# Patient Record
Sex: Female | Born: 1995 | Race: Black or African American | Hispanic: No | Marital: Single | Smoking: Never smoker
Health system: Southern US, Community
[De-identification: ages and names within clinical notes are randomized; demographics above are authoritative.]

## PROBLEM LIST (undated history)

## (undated) HISTORY — PX: OTHER SURGICAL HISTORY: SHX169

---
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
## 2016-04-03 DIAGNOSIS — N912 Amenorrhea, unspecified: Secondary | ICD-10-CM | POA: Insufficient documentation

## 2016-10-13 ENCOUNTER — Encounter (HOSPITAL_BASED_OUTPATIENT_CLINIC_OR_DEPARTMENT_OTHER): Payer: Self-pay | Admitting: Emergency Medicine

## 2016-10-13 ENCOUNTER — Emergency Department (HOSPITAL_BASED_OUTPATIENT_CLINIC_OR_DEPARTMENT_OTHER): Payer: BLUE CROSS/BLUE SHIELD

## 2016-10-13 ENCOUNTER — Emergency Department (HOSPITAL_BASED_OUTPATIENT_CLINIC_OR_DEPARTMENT_OTHER)
Admission: EM | Admit: 2016-10-13 | Discharge: 2016-10-13 | Disposition: A | Discharge status: Home / Self Care | Payer: BLUE CROSS/BLUE SHIELD | Attending: Emergency Medicine | Admitting: Emergency Medicine

## 2016-10-13 DIAGNOSIS — S62025A Nondisplaced fracture of middle third of navicular [scaphoid] bone of left wrist, initial encounter for closed fracture: Secondary | ICD-10-CM | POA: Diagnosis not present

## 2016-10-13 DIAGNOSIS — Y999 Unspecified external cause status: Secondary | ICD-10-CM | POA: Diagnosis not present

## 2016-10-13 DIAGNOSIS — Y929 Unspecified place or not applicable: Secondary | ICD-10-CM | POA: Diagnosis not present

## 2016-10-13 DIAGNOSIS — Y939 Activity, unspecified: Secondary | ICD-10-CM | POA: Insufficient documentation

## 2016-10-13 DIAGNOSIS — S6992XA Unspecified injury of left wrist, hand and finger(s), initial encounter: Secondary | ICD-10-CM | POA: Diagnosis present

## 2016-10-13 NOTE — ED Notes (Signed)
Patient transported to X-ray 

## 2016-10-13 NOTE — Discharge Instructions (Signed)
You can take 800 mg of ibuprofen with food or 650 mg of Tylenol every 6 hours for pain control. Please call Dr. Debby Budoley's office on Monday morning to schedule a follow-up appointment. If he develop worsening symptoms, including numbness, weakness, or tingling in the fingers, please return to the emergency department for reevaluation.
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
## 2016-10-13 NOTE — ED Notes (Signed)
Ice pack given for comfort.

## 2017-05-10 ENCOUNTER — Encounter (HOSPITAL_BASED_OUTPATIENT_CLINIC_OR_DEPARTMENT_OTHER): Payer: Self-pay | Admitting: Emergency Medicine

## 2017-05-10 ENCOUNTER — Other Ambulatory Visit: Payer: Self-pay

## 2017-05-10 ENCOUNTER — Emergency Department (HOSPITAL_BASED_OUTPATIENT_CLINIC_OR_DEPARTMENT_OTHER)
Admission: EM | Admit: 2017-05-10 | Discharge: 2017-05-10 | Disposition: A | Discharge status: Home / Self Care | Payer: BLUE CROSS/BLUE SHIELD | Attending: Emergency Medicine | Admitting: Emergency Medicine

## 2017-05-10 DIAGNOSIS — S0990XA Unspecified injury of head, initial encounter: Secondary | ICD-10-CM | POA: Diagnosis present

## 2017-05-10 DIAGNOSIS — Y999 Unspecified external cause status: Secondary | ICD-10-CM | POA: Insufficient documentation

## 2017-05-10 DIAGNOSIS — Y929 Unspecified place or not applicable: Secondary | ICD-10-CM | POA: Diagnosis not present

## 2017-05-10 DIAGNOSIS — X58XXXA Exposure to other specified factors, initial encounter: Secondary | ICD-10-CM | POA: Insufficient documentation

## 2017-05-10 DIAGNOSIS — Y939 Activity, unspecified: Secondary | ICD-10-CM | POA: Diagnosis not present

## 2017-05-10 DIAGNOSIS — Z5321 Procedure and treatment not carried out due to patient leaving prior to being seen by health care provider: Secondary | ICD-10-CM | POA: Diagnosis not present
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
## 2019-03-09 IMAGING — DX DG WRIST COMPLETE 3+V*L*
4 series · 4 of 4 positions shown · non-contrast
Comparison: None available

CLINICAL DATA: Acute left radial wrist pain

EXAM:
LEFT WRIST - COMPLETE 3+ VIEW

[wrist pa]
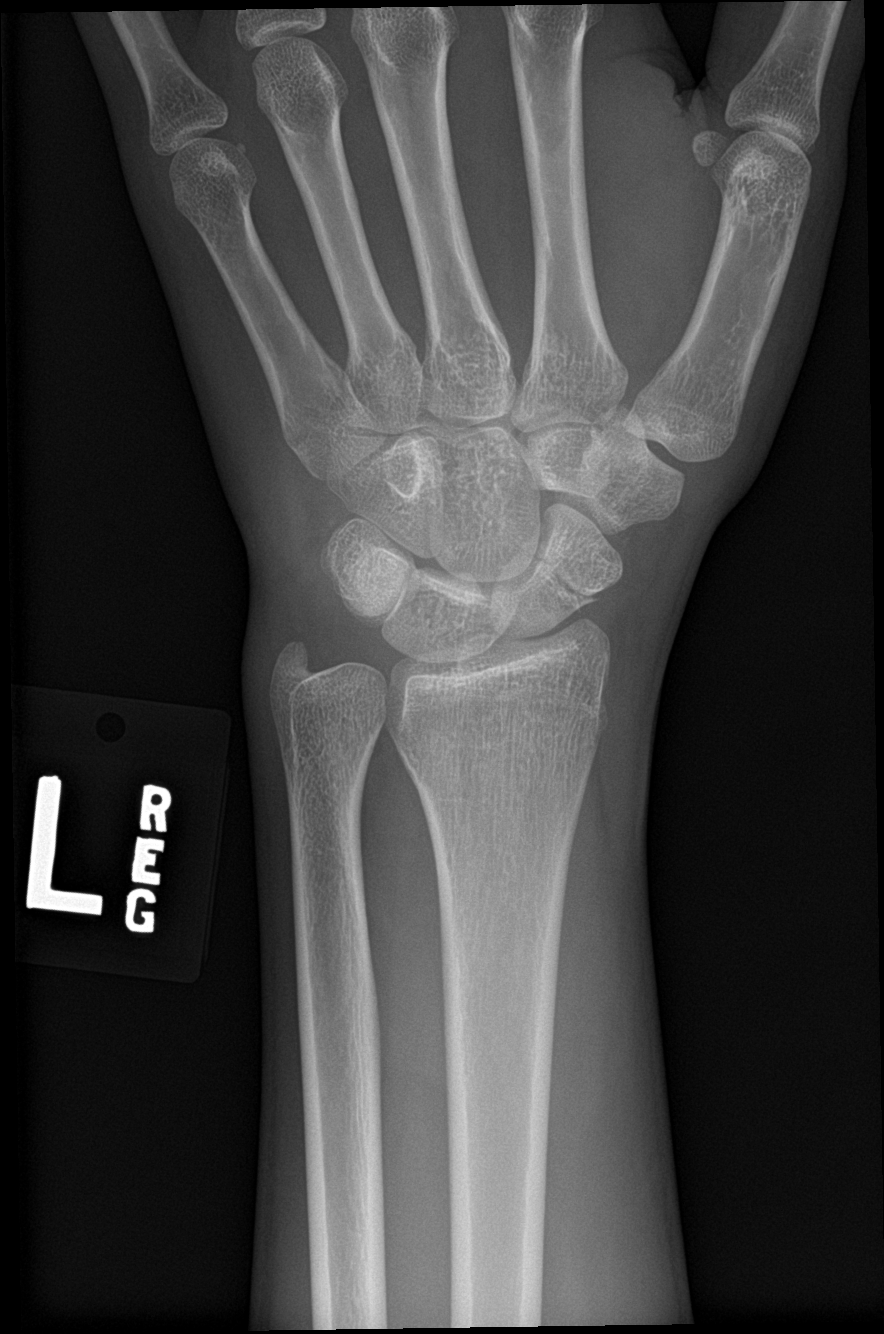

[wrist obl]
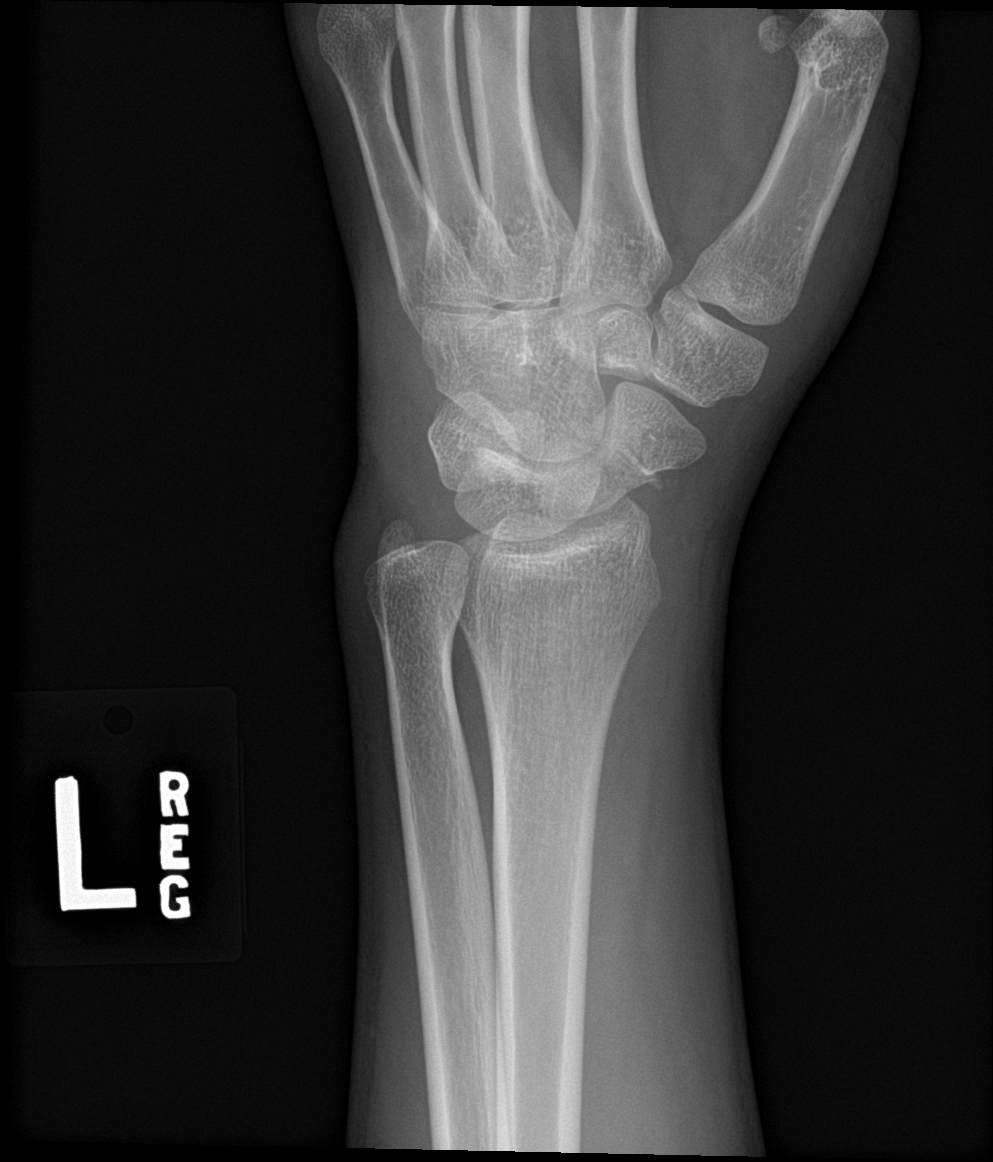

[wrist lat]
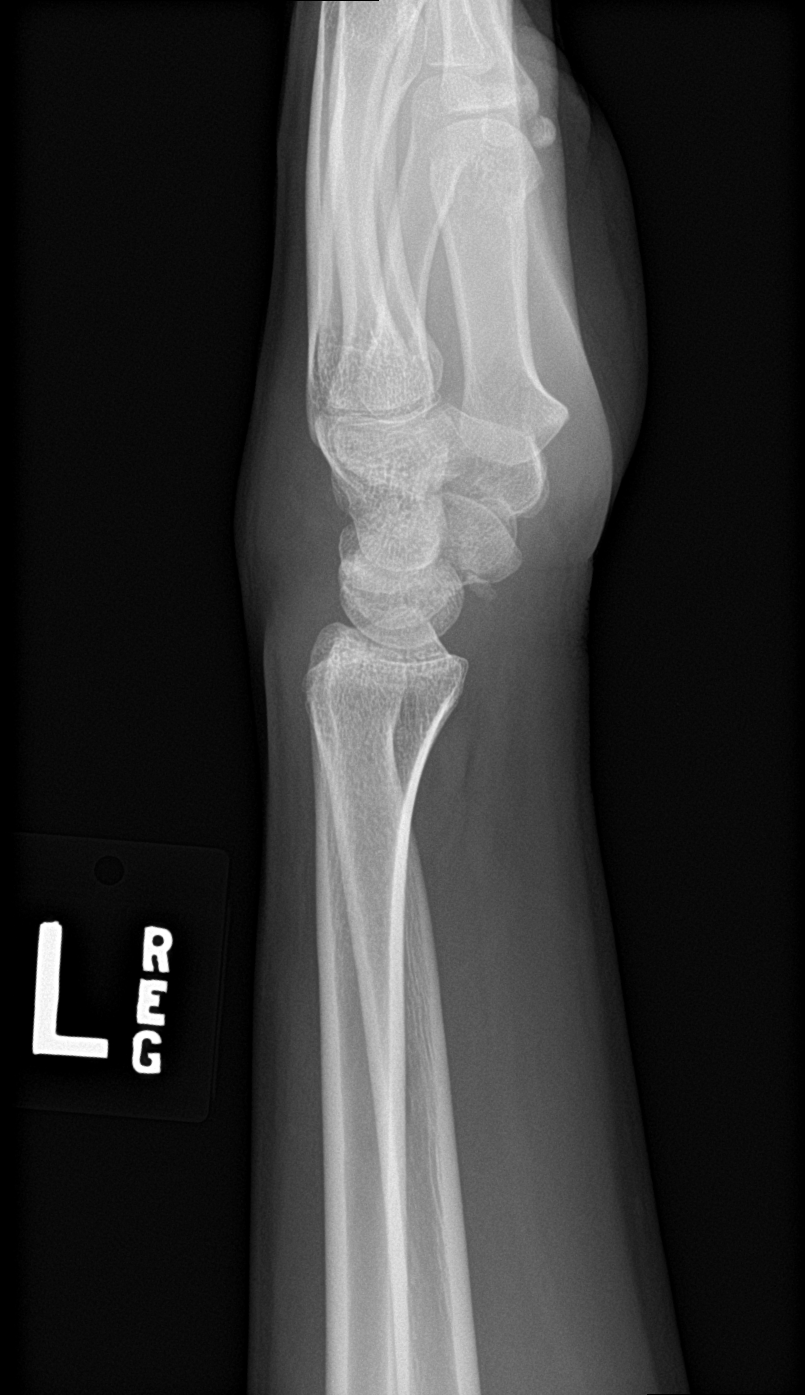

[wrist navicular]
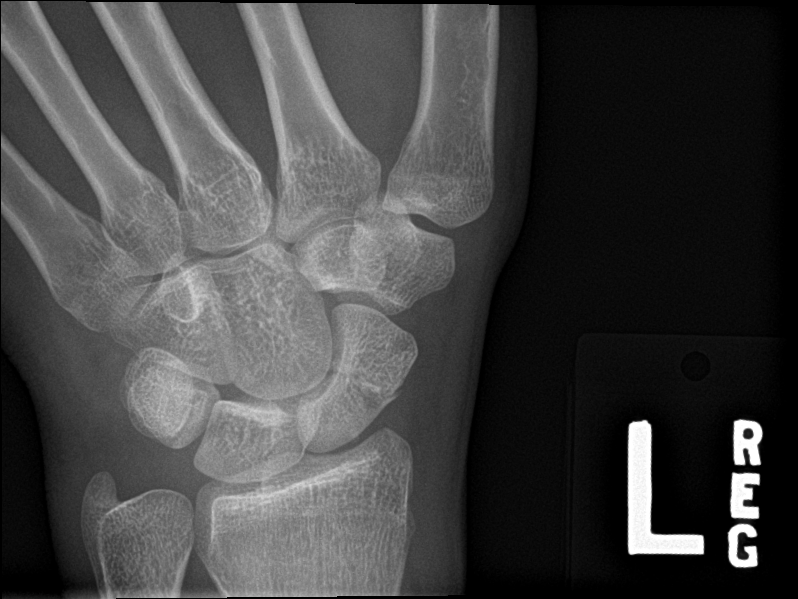

[4 of 4 positions shown; findings below may reference images not displayed]

FINDINGS: There is an acute nondisplaced scaphoid waist fracture. Distal
radius and ulna appear intact. Dorsal soft tissue swelling on the
lateral view. Other carpal bones appear intact. No subluxation or
dislocation.
IMPRESSION: Acute nondisplaced scaphoid waist fracture.

## 2020-08-18 LAB — HM PAP SMEAR

## 2022-07-23 ENCOUNTER — Emergency Department (HOSPITAL_BASED_OUTPATIENT_CLINIC_OR_DEPARTMENT_OTHER): Payer: Self-pay

## 2022-07-23 ENCOUNTER — Other Ambulatory Visit: Payer: Self-pay

## 2022-07-23 ENCOUNTER — Emergency Department (HOSPITAL_BASED_OUTPATIENT_CLINIC_OR_DEPARTMENT_OTHER)
Admission: EM | Admit: 2022-07-23 | Discharge: 2022-07-23 | Disposition: A | Discharge status: Home / Self Care | Payer: Self-pay | Attending: Emergency Medicine | Admitting: Emergency Medicine

## 2022-07-23 ENCOUNTER — Encounter (HOSPITAL_BASED_OUTPATIENT_CLINIC_OR_DEPARTMENT_OTHER): Payer: Self-pay | Admitting: Urology

## 2022-07-23 DIAGNOSIS — S098XXA Other specified injuries of head, initial encounter: Secondary | ICD-10-CM

## 2022-07-23 DIAGNOSIS — W228XXA Striking against or struck by other objects, initial encounter: Secondary | ICD-10-CM | POA: Insufficient documentation

## 2022-07-23 DIAGNOSIS — S0181XA Laceration without foreign body of other part of head, initial encounter: Secondary | ICD-10-CM

## 2022-07-23 DIAGNOSIS — S01111A Laceration without foreign body of right eyelid and periocular area, initial encounter: Secondary | ICD-10-CM | POA: Insufficient documentation

## 2022-07-23 LAB — PREGNANCY, URINE: Preg Test, Ur: NEGATIVE

## 2022-07-23 MED ORDER — ACETAMINOPHEN 325 MG PO TABS
650.0000 mg | ORAL_TABLET | Freq: Four times a day (QID) | ORAL | Status: DC | PRN
  Administered 2022-07-23: 650 mg via ORAL
  Filled 2022-07-23: qty 2

## 2022-07-23 MED ORDER — ONDANSETRON 4 MG PO TBDP
4.0000 mg | ORAL_TABLET | Freq: Once | ORAL | Status: AC
  Administered 2022-07-23: 4 mg via ORAL
  Filled 2022-07-23: qty 1

## 2022-07-23 MED ORDER — METOCLOPRAMIDE HCL 5 MG/ML IJ SOLN
10.0000 mg | Freq: Once | INTRAMUSCULAR | Status: AC
  Administered 2022-07-23: 10 mg via INTRAMUSCULAR
  Filled 2022-07-23: qty 2

## 2022-07-23 MED ORDER — LIDOCAINE HCL (PF) 1 % IJ SOLN
5.0000 mL | Freq: Once | INTRAMUSCULAR | Status: AC
  Administered 2022-07-23: 5 mL via INTRADERMAL
  Filled 2022-07-23: qty 5

## 2022-07-23 NOTE — Discharge Instructions (Addendum)
It was a pleasure taking care of you today. Your CT was did not have concern for fractures; however, it did show dental decay and periodontal disease, most notable at left mandibular tooth 21. I recommend following up with dentist. Also, seek emergency care if experiencing new or worsening symptoms.  Suture instructions: Non-asorbable sutures: Keep sutures dry. Please refrain from swimming or bathing. Area can be gently cleaned with mild soap and water after 24 hours. Please return to local urgent care in 5 days for suture removal. Failure to remove sutures in timely manner can result in infection.

## 2022-07-23 NOTE — ED Notes (Signed)
Pt vomiting, tylenol at bedside, pt did not take at time of administration on MAR d/t nausea
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
## 2023-09-30 ENCOUNTER — Ambulatory Visit (INDEPENDENT_AMBULATORY_CARE_PROVIDER_SITE_OTHER): Payer: Self-pay | Admitting: Family Medicine

## 2023-09-30 ENCOUNTER — Encounter: Payer: Self-pay | Admitting: Family Medicine

## 2023-09-30 ENCOUNTER — Other Ambulatory Visit (HOSPITAL_COMMUNITY)
Admission: RE | Admit: 2023-09-30 | Discharge: 2023-09-30 | Disposition: A | Discharge status: Home / Self Care | Payer: Self-pay | Source: Ambulatory Visit | Attending: Family Medicine | Admitting: Family Medicine

## 2023-09-30 VITALS — BP 133/79 | HR 69 | Ht 65.0 in | Wt 136.0 lb

## 2023-09-30 DIAGNOSIS — Z0000 Encounter for general adult medical examination without abnormal findings: Secondary | ICD-10-CM

## 2023-09-30 DIAGNOSIS — Z113 Encounter for screening for infections with a predominantly sexual mode of transmission: Secondary | ICD-10-CM | POA: Insufficient documentation

## 2023-09-30 DIAGNOSIS — Z30011 Encounter for initial prescription of contraceptive pills: Secondary | ICD-10-CM | POA: Diagnosis not present

## 2023-09-30 LAB — POCT URINE PREGNANCY: Preg Test, Ur: NEGATIVE

## 2023-09-30 MED ORDER — NORETHIN ACE-ETH ESTRAD-FE 1-20 MG-MCG(24) PO TABS: 1.0000 | ORAL_TABLET | Freq: Every day | ORAL | 4 refills | Status: AC
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
## 2023-09-30 NOTE — Patient Instructions (Addendum)
 [email or web address] for birth control options   ----------------------------------------   Thank you for choosing [facility] at [facility] for your Primary Care needs. I am excited for the opportunity to partner with you to meet your health care goals. It was a pleasure meeting you today!  Information on diet, exercise, and health maintenance recommendations are listed below. This is information to help you be sure you are on track for optimal health and monitoring.   Please look over this and let us  know if you have any questions or if you have completed any of the health maintenance outside of [facility] so that we can be sure your records are up to date.  ___________________________________________________________  MyChart:  For all urgent or time sensitive needs we ask that you please call the office to avoid delays. Our number is [phone]. MyChart is not constantly monitored and due to the large volume of messages a day, replies may take up to 72 business hours.  MyChart Policy: MyChart allows for you to see your visit notes, after visit summary, provider recommendations, lab and tests results, make an appointment, request refills, and contact your provider or the office for non-urgent questions or concerns. Providers are seeing patients during normal business hours and do not have built in time to review MyChart messages.  We ask that you allow a minimum of 3 business days for responses to [employer]. For this reason, please do not send urgent requests through MyChart. Please call the office at [phone]. New and ongoing conditions may require a visit. We have virtual and in-person visits available for your convenience.  Complex MyChart concerns may require a visit. Your provider may request you schedule a virtual or in-person visit to ensure we are providing the best care possible. MyChart messages sent after 11:00 AM on Friday may not be received by the  provider until Monday morning.    Lab and Test Results: You will receive your lab and test results on MyChart as soon as they are completed and results have been sent by the lab or testing facility. Due to this service, you will receive your results BEFORE your provider.  I review lab and test results each morning prior to seeing patients. Some results require collaboration with other providers to ensure you are receiving the most appropriate care. For this reason, we ask that you please allow a minimum of 3-5 business days from the time that ALL results have been received for your provider to receive and review lab and test results and contact you about these.  Most lab and test result comments from the provider will be sent through MyChart. Your provider may recommend changes to the plan of care, follow-up visits, repeat testing, ask questions, or request an office visit to discuss these results. You may reply directly to this message or call the office to provide information for the provider or set up an appointment. In some instances, you will be called with test results and recommendations. Please let us  know if this is preferred and we will make note of this in your chart to provide this for you.    If you have not heard a response to your lab or test results in 5 business days from all results returning to MyChart, please call the office to let us  know. We ask that you please avoid calling prior to this time unless there is an emergent concern. Due to high call volumes, this can delay the resulting process.  After Hours: For all non-emergency after hours needs, please call the office at [phone] and select the option to reach the on-call  service. On-call services are shared between multiple [place] offices and therefore it will not be possible to speak directly with your provider. On-call providers may provide medical advice and recommendations, but are unable to provide refills for  maintenance medications.  For all emergency or urgent medical needs after normal business hours, we recommend that you seek care at the closest Urgent Care or Emergency Department to ensure appropriate treatment in a timely manner.  [facility] has a 24 hour emergency room located on the ground floor for your convenience.   Urgent Concerns During the Business Day Providers are seeing patients from 8AM to 5PM with a busy schedule and are most often not able to respond to non-urgent calls until the end of the day or the next business day. If you should have URGENT concerns during the day, please call and speak to the nurse or schedule a same day appointment so that we can address your concern without delay.   Thank you, again, for choosing me as your health care partner. I appreciate your trust and look forward to learning more about you!   Waddell FURY Almarie, DNP, FNP-C  ___________________________________________________________  Health Maintenance Recommendations Screening Testing Mammogram Every 1-2 years based on history and risk factors Starting at age 27 Pap Smear Ages 21-39 every 3 years Ages 27-65 every 5 years with HPV testing More frequent testing may be required based on results and history Colon Cancer Screening Every 1-10 years based on test performed, risk factors, and history Starting at age 27 Bone Density Screening Every 2-10 years based on history Starting at age 27 for women Recommendations for men differ based on medication usage, history, and risk factors AAA Screening One time ultrasound Men 27-65 years old who have ever smoked Lung Cancer Screening Low Dose Lung CT every 12 months Age 27-80 years with a 20 pack-year smoking history who still smoke or who have quit within the last 15 years  Screening Labs Routine  Labs: Complete Blood Count (CBC), Complete Metabolic Panel (CMP), Cholesterol (Lipid Panel) Every 6-12 months based on history and  medications May be recommended more frequently based on current conditions or previous results Hemoglobin A1c Lab Every 3-12 months based on history and previous results Starting at age 50 or earlier with diagnosis of diabetes, high cholesterol, BMI >26, and/or risk factors Frequent monitoring for patients with diabetes to ensure blood sugar control Thyroid Panel  Every 6 months based on history, symptoms, and risk factors May be repeated more often if on medication HIV One time testing for all patients 34 and older May be repeated more frequently for patients with increased risk factors or exposure Hepatitis C One time testing for all patients 69 and older May be repeated more frequently for patients with increased risk factors or exposure Gonorrhea, Chlamydia Every 12 months for all sexually active persons 13-24 years Additional monitoring may be recommended for those who are considered high risk or who have symptoms PSA Men 29-96 years old with risk factors Additional screening may be recommended from age 47-69 based on risk factors, symptoms, and history  Vaccine Recommendations Tetanus Booster All adults every 10 years Flu Vaccine All patients 6 months and older every year COVID Vaccine All patients 12 years and older Initial dosing with booster May recommend additional booster based on age and health history HPV Vaccine 2  doses all patients age 92-26 Dosing may be considered for patients over 26 Shingles Vaccine (Shingrix) 2 doses all adults 50 years and older Pneumonia (Pneumovax 63) All adults 65 years and older May recommend earlier dosing based on health history Pneumonia (Prevnar 65) All adults 65 years and older Dosed 1 year after Pneumovax 23 Pneumonia (Prevnar 20) All adults 65 years and older (adults 19-64 with certain conditions or risk factors) 1 dose  For those who have not received Prevnar 13 vaccine previously   Additional Screening, Testing, and  Vaccinations may be recommended on an individualized basis based on family history, health history, risk factors, and/or exposure.  __________________________________________________________  Diet Recommendations for All Patients  I recommend that all patients maintain a diet low in saturated fats, carbohydrates, and cholesterol. While this can be challenging at first, it is not impossible and small changes can make big differences.  Things to try: Decreasing the amount of soda, sweet tea, and/or juice to one or less per day and replace with water While water is always the first choice, if you do not like water you may consider adding a water additive without sugar to improve the taste other sugar free drinks Replace potatoes with a brightly colored vegetable  Use healthy oils, such as canola oil or olive oil, instead of butter or hard margarine Limit your bread intake to two pieces or less a day Replace regular pasta with low carb pasta options Bake, broil, or grill foods instead of frying Monitor portion sizes  Eat smaller, more frequent meals throughout the day instead of large meals  An important thing to remember is, if you love foods that are not great for your health, you don't have to give them up completely. Instead, allow these foods to be a reward when you have done well. Allowing yourself to still have special treats every once in a while is a nice way to tell yourself thank you for working hard to keep yourself healthy.   Also remember that every day is a new day. If you have a bad day and fall off the wagon, you can still climb right back up and keep moving along on your journey!  We have resources available to help you!  Some websites that may be helpful include: www.http://www.wall-moore.info/  Www.VeryWellFit.com _____________________________________________________________  Activity Recommendations for All Patients  I recommend that all adults get at least 30 minutes of moderate  physical activity that elevates your heart rate at least 5 days out of the week.  Some examples include: Walking or jogging at a pace that allows you to carry on a conversation Cycling (stationary bike or outdoors) Water aerobics Yoga Weight lifting Dancing If physical limitations prevent you from putting stress on your joints, exercise in a pool or seated in a chair are excellent options.  Do determine your MAXIMUM heart rate for activity: 220 - YOUR AGE = MAX Heart Rate   Remember! Do not push yourself too hard.  Start slowly and build up your pace, speed, weight, time in exercise, etc.  Allow your body to rest between exercise and get good sleep. You will need more water than normal when you are exerting yourself. Do not wait until you are thirsty to drink. Drink with a purpose of getting in at least 8, 8 ounce glasses of water a day plus more depending on how much you exercise and sweat.    If you begin to develop dizziness, chest pain, abdominal pain, jaw pain, shortness of  breath, headache, vision changes, lightheadedness, or other concerning symptoms, stop the activity and allow your body to rest. If your symptoms are severe, seek emergency evaluation immediately. If your symptoms are concerning, but not severe, please let us  know so that we can recommend further evaluation.

## 2023-09-30 NOTE — Addendum Note (Signed)
 Addended by: Deshon Hsiao L on: 09/30/2023 02:49 PM   Modules accepted: Orders

## 2023-10-03 ENCOUNTER — Ambulatory Visit: Payer: Self-pay | Admitting: Family Medicine

## 2023-10-03 DIAGNOSIS — A599 Trichomoniasis, unspecified: Secondary | ICD-10-CM

## 2023-10-03 DIAGNOSIS — B9689 Other specified bacterial agents as the cause of diseases classified elsewhere: Secondary | ICD-10-CM

## 2023-10-03 LAB — HSV(HERPES SIMPLEX VRS) I + II AB-IGG
HSV 1 IGG,TYPE SPECIFIC AB: <0.9 {index}
HSV 2 IGG,TYPE SPECIFIC AB: <0.9 {index}

## 2023-10-03 LAB — CERVICOVAGINAL ANCILLARY ONLY
Bacterial Vaginitis (gardnerella): POSITIVE — AB
Candida Glabrata: NEGATIVE
Candida Vaginitis: NEGATIVE
Chlamydia: NEGATIVE
Comment: NEGATIVE
Comment: NEGATIVE
Comment: NEGATIVE
Comment: NEGATIVE
Comment: NEGATIVE
Comment: NORMAL
Neisseria Gonorrhea: NEGATIVE
Trichomonas: POSITIVE — AB

## 2023-10-03 LAB — HIV ANTIBODY (ROUTINE TESTING W REFLEX): HIV 1&2 Ab, 4th Generation: NONREACTIVE

## 2023-10-03 LAB — RPR: RPR Ser Ql: NONREACTIVE

## 2023-10-03 LAB — HEPATITIS C ANTIBODY: Hepatitis C Ab: NONREACTIVE

## 2023-10-03 MED ORDER — METRONIDAZOLE 500 MG PO TABS: 500.0000 mg | ORAL_TABLET | Freq: Two times a day (BID) | ORAL | 0 refills | Status: AC

## 2023-10-07 NOTE — Telephone Encounter (Signed)
 His [employer] is in his account for the visit. He will just get billed a copay

## 2024-04-01 ENCOUNTER — Encounter: Admitting: Family Medicine
